# Patient Record
Sex: Male | Born: 1945 | Race: Black or African American | Hispanic: No
Health system: Southern US, Community
[De-identification: ages and names within clinical notes are randomized; demographics above are authoritative.]

## PROBLEM LIST (undated history)

## (undated) DIAGNOSIS — Z931 Gastrostomy status: Secondary | ICD-10-CM

## (undated) DIAGNOSIS — I509 Heart failure, unspecified: Secondary | ICD-10-CM

## (undated) DIAGNOSIS — G459 Transient cerebral ischemic attack, unspecified: Secondary | ICD-10-CM

## (undated) DIAGNOSIS — K859 Acute pancreatitis without necrosis or infection, unspecified: Secondary | ICD-10-CM

## (undated) DIAGNOSIS — M6281 Muscle weakness (generalized): Secondary | ICD-10-CM

## (undated) DIAGNOSIS — R1310 Dysphagia, unspecified: Secondary | ICD-10-CM

## (undated) DIAGNOSIS — C801 Malignant (primary) neoplasm, unspecified: Secondary | ICD-10-CM

## (undated) DIAGNOSIS — F1010 Alcohol abuse, uncomplicated: Secondary | ICD-10-CM

---

## 2017-08-03 ENCOUNTER — Encounter (HOSPITAL_COMMUNITY): Payer: Self-pay

## 2017-08-03 ENCOUNTER — Emergency Department (HOSPITAL_COMMUNITY): Payer: Self-pay

## 2017-08-03 ENCOUNTER — Emergency Department (HOSPITAL_COMMUNITY)
Admission: EM | Admit: 2017-08-03 | Discharge: 2017-08-03 | Disposition: A | Discharge status: Home / Self Care | Payer: Self-pay | Attending: Emergency Medicine | Admitting: Emergency Medicine

## 2017-08-03 DIAGNOSIS — K9423 Gastrostomy malfunction: Secondary | ICD-10-CM | POA: Insufficient documentation

## 2017-08-03 DIAGNOSIS — I509 Heart failure, unspecified: Secondary | ICD-10-CM | POA: Insufficient documentation

## 2017-08-03 HISTORY — DX: Malignant (primary) neoplasm, unspecified: C80.1

## 2017-08-03 HISTORY — DX: Heart failure, unspecified: I50.9

## 2017-08-03 HISTORY — DX: Gastrostomy status: Z93.1

## 2017-08-03 HISTORY — DX: Transient cerebral ischemic attack, unspecified: G45.9

## 2017-08-03 HISTORY — DX: Dysphagia, unspecified: R13.10

## 2017-08-03 HISTORY — DX: Alcohol abuse, uncomplicated: F10.10

## 2017-08-03 HISTORY — DX: Muscle weakness (generalized): M62.81

## 2017-08-03 HISTORY — DX: Acute pancreatitis without necrosis or infection, unspecified: K85.90

## 2017-08-03 MED ORDER — IOPAMIDOL (ISOVUE-300) INJECTION 61%
50.0000 mL | Freq: Once | INTRAVENOUS | Status: AC | PRN
  Administered 2017-08-03: 30 mL

## 2017-08-03 NOTE — Discharge Instructions (Signed)
Follow up with your PCP and see if you need the tube size changed.  Return if your tube stops working.

## 2017-08-03 NOTE — ED Notes (Signed)
Bed: XE94 Expected date:  Expected time:  Means of arrival:  Comments: Feeding tube dislodged

## 2017-08-03 NOTE — ED Triage Notes (Signed)
Per Ems: Pt coming from home c/o peg tube displacement this morning. Nonverbal at baseline.   DNR at bedside.

## 2017-08-03 NOTE — ED Provider Notes (Addendum)
[facility] DEPT Provider Note   CSN: [id number] Arrival date & time: 08/03/17  0704     History   Chief Complaint Chief Complaint  Patient presents with  . feeding tube dislodged    HPI Xavier Clark is a 72 y.o. male.  72 yo M with a chief complaint of a feeding tube dislodgment.  He was having some difficulty with it yesterday and the hospice nurses were out to see him.  They were able to troubleshoot the tube which was clamped and they were able to feed him through it without difficulty.  This morning they received a call that he had pulled out inadvertently.  He was then sent to the emergency department.  The patient provides minimal history due to aphasia.  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  He is currently fed exclusively through a percutaneous feeding tube.  There is no documentation of whether or not this is a GJ tube, what size the tubing is.  No documentation of how long its been in place.  The history is provided by the patient.  Illness  This is a new problem. The current episode started 6 to 12 hours ago. The problem occurs constantly. The problem has not changed since onset.Pertinent negatives include no chest pain, no abdominal pain, no headaches and no shortness of breath. Nothing aggravates the symptoms. Nothing relieves the symptoms. He has tried nothing for the symptoms. The treatment provided no relief.    Past Medical History:  Diagnosis Date  . Alcohol abuse   . CHF (congestive heart failure) ([place])   . Dysphagia   . 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 Malignant neoplasm ([place])   . Muscle weakness   . Pancreatitis   . TIA (transient ischemic attack)     There are no active problems to display for this patient.   History reviewed. No pertinent surgical history.      Home Medications    Prior to Admission medications   Not on File     Family History No family history on file.  Social History Social History   Tobacco Use  . Smoking status: Not on file  Substance Use Topics  . Alcohol use: Not on file  . Drug use: Not on file     Allergies   Patient has no known allergies.   Review of Systems Review of Systems  Constitutional: Negative for chills and fever.  HENT: Negative for congestion and facial swelling.   Eyes: Negative for discharge and visual disturbance.  Respiratory: Negative for shortness of breath.   Cardiovascular: Negative for chest pain and palpitations.  Gastrointestinal: Negative for abdominal pain, diarrhea and vomiting.  Musculoskeletal: Negative for arthralgias and myalgias.  Skin: Negative for color change and rash.  Neurological: Negative for tremors, syncope and headaches.  Psychiatric/Behavioral: Negative for confusion and dysphoric mood.     Physical Exam Updated Vital Signs BP 111/83   Pulse 71   Temp 97.6 F (36.4 C) (Oral)   Resp 18   SpO2 99%   Physical Exam  Constitutional: He appears well-developed and well-nourished.  HENT:  Head: Normocephalic and atraumatic.  Eyes: Pupils are equal, round, and reactive to light. EOM are normal.  Neck: Normal range of motion. Neck supple. No JVD present.  Cardiovascular: Normal rate and regular rhythm. Exam reveals no gallop and no friction rub.  No murmur heard. Pulmonary/Chest:  No respiratory distress. He has no wheezes.  Abdominal: He exhibits no distension and no mass. There is no tenderness. There is no rebound and no guarding.  Ostomy site without feeding tube.  Musculoskeletal: Normal range of motion.  Neurological: He is alert.  Slurred speech but able to answer questions.  Difficult to understand   Skin: No rash noted. No pallor.  Psychiatric: He has a normal mood and affect. His behavior is normal.  Nursing note and vitals reviewed.    ED Treatments / Results  Labs (all labs ordered are listed, but only  abnormal results are displayed) Labs Reviewed - No data to display  EKG None  Radiology Dg Abdomen Peg Tube Location  Result Date: 08/03/2017 CLINICAL DATA:  Gastrostomy tube dysfunction EXAM: ABDOMEN - 1 VIEW COMPARISON:  None. FINDINGS: Gastrostomy tube projects over the left upper quadrant. Contrast is noted within the stomach. No visible contrast extravasation. Mild diffuse gaseous distention of bowel, possibly mild ileus. IMPRESSION: Gastrostomy tube projects in the left upper quadrant with contrast in the stomach. No visible extravasation. Electronically Signed   By: Rolm Baptise M.D.   On: 08/03/2017 09:13    Procedures FEEDING TUBE REPLACEMENT Date/Time: 08/03/2017 11:47 AM Performed by: Deno Etienne, DO Authorized by: Deno Etienne, DO  Consent: Verbal consent not obtained. Risks and benefits: risks, benefits and alternatives were discussed Consent given by: hospice. Indications: tube dislodged Local anesthesia used: no  Anesthesia: Local anesthesia used: no  Sedation: Patient sedated: no  Tube type: gastrostomy Patient position: supine Procedure type: replacement Tube size: 16 Fr Endoscope used: no Bulb inflation volume: 5 (ml) Bulb inflation fluid: normal saline Placement/position confirmation: x-ray and contrast Tube placement difficulty: moderate Patient tolerance: Patient tolerated the procedure well with no immediate complications    (including critical care time)  Medications Ordered in ED Medications  iopamidol (ISOVUE-300) 61 % injection 50 mL (30 mLs Other Contrast Given 08/03/17 0909)     Initial Impression / Assessment and Plan / ED Course  I have reviewed the triage vital signs and the nursing notes.  Pertinent labs & imaging results that were available during my care of the patient were reviewed by me and considered in my medical decision making (see chart for details).     72 yo M with a dislodged feeding tube.  Unfortunately there is no  documentation of where this procedure was performed what type of tube it is and what size it is.  I will attempt to place a G-tube.  If able to place will let the primary team evaluate if this needs to be upper downsized or replaced with a GJ. Tried to place a 20 and was unable.  16 placed without difficulty though no noted gastric contents returned.  Xray with good placement as viewed by me.   9:23 AM:  I have discussed the diagnosis/risks/treatment options with the patient and believe the pt to be eligible for discharge home to follow-up with PCP. We also discussed returning to the ED immediately if new or worsening sx occur. We discussed the sx which are most concerning (e.g., sudden worsening pain, fever, inability to tolerate by mouth) that necessitate immediate return. Medications administered to the patient during their visit and any new prescriptions provided to the patient are listed below.  Medications given during this visit Medications  iopamidol (ISOVUE-300) 61 % injection 50 mL (30 mLs Other Contrast Given 08/03/17 0909)      The patient appears reasonably screen and/or stabilized for discharge  and I doubt any other medical condition or other [facility] requiring further screening, evaluation, or treatment in the ED at this time prior to discharge.    Final Clinical Impressions(s) / ED Diagnoses   Final diagnoses:  Gastrostomy tube dysfunction [facility])    ED Discharge Orders    None       Deno Etienne, DO 08/03/17 [place], [place], DO 08/03/17 1148

## 2017-08-03 NOTE — ED Notes (Signed)
[facility] called to transport pt back to [facility] [address], [place] [id number]

## 2017-08-19 DEATH — deceased

## 2019-11-29 IMAGING — CR DG ABDOMEN 1V
1 series · 1 of 1 positions shown · non-contrast
Comparison: None.

CLINICAL DATA: Gastrostomy tube dysfunction

EXAM:
ABDOMEN - 1 VIEW

[t abdomen supine]
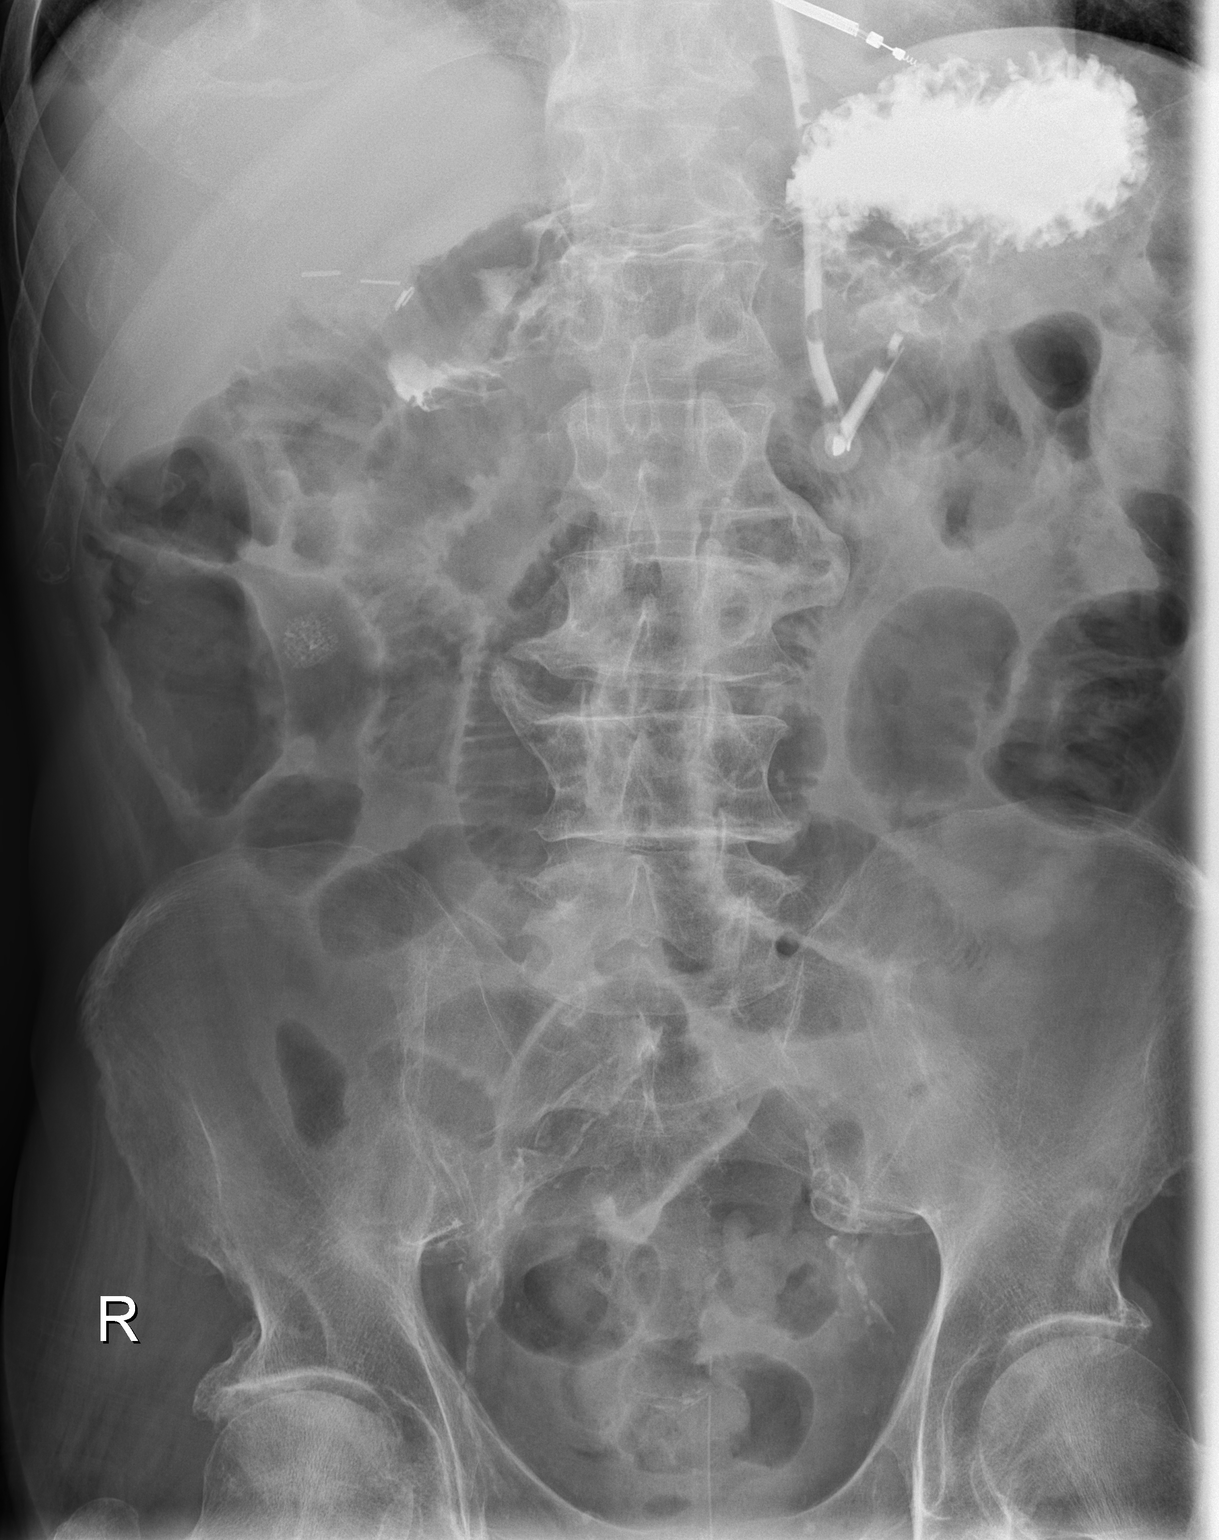

[1 of 1 positions shown; findings below may reference images not displayed]

FINDINGS: Gastrostomy tube projects over the left upper quadrant. Contrast is
noted within the stomach. No visible contrast extravasation. Mild
diffuse gaseous distention of bowel, possibly mild ileus.
IMPRESSION: Gastrostomy tube projects in the left upper quadrant with contrast
in the stomach. No visible extravasation.
